# Patient Record
Sex: Male | Born: 1960 | Race: Black or African American | Hispanic: No | Marital: Single | Smoking: Former smoker
Health system: Southern US, Community
[De-identification: ages and names within clinical notes are randomized; demographics above are authoritative.]

## PROBLEM LIST (undated history)

## (undated) DIAGNOSIS — S069XAA Unspecified intracranial injury with loss of consciousness status unknown, initial encounter: Secondary | ICD-10-CM

## (undated) DIAGNOSIS — R42 Dizziness and giddiness: Secondary | ICD-10-CM

## (undated) DIAGNOSIS — I10 Essential (primary) hypertension: Secondary | ICD-10-CM

## (undated) DIAGNOSIS — Z87828 Personal history of other (healed) physical injury and trauma: Secondary | ICD-10-CM

## (undated) DIAGNOSIS — S069X9A Unspecified intracranial injury with loss of consciousness of unspecified duration, initial encounter: Secondary | ICD-10-CM

## (undated) DIAGNOSIS — E7800 Pure hypercholesterolemia, unspecified: Secondary | ICD-10-CM

## (undated) DIAGNOSIS — E119 Type 2 diabetes mellitus without complications: Secondary | ICD-10-CM

---

## 2018-08-08 ENCOUNTER — Emergency Department (HOSPITAL_COMMUNITY)
Admission: EM | Admit: 2018-08-08 | Discharge: 2018-08-09 | Disposition: A | Discharge status: Home / Self Care | Payer: Medicare Other | Attending: Emergency Medicine | Admitting: Emergency Medicine

## 2018-08-08 ENCOUNTER — Other Ambulatory Visit: Payer: Self-pay

## 2018-08-08 ENCOUNTER — Encounter (HOSPITAL_COMMUNITY): Payer: Self-pay | Admitting: Emergency Medicine

## 2018-08-08 DIAGNOSIS — F322 Major depressive disorder, single episode, severe without psychotic features: Secondary | ICD-10-CM | POA: Diagnosis present

## 2018-08-08 DIAGNOSIS — F251 Schizoaffective disorder, depressive type: Secondary | ICD-10-CM | POA: Insufficient documentation

## 2018-08-08 DIAGNOSIS — Z5900 Homelessness unspecified: Secondary | ICD-10-CM

## 2018-08-08 DIAGNOSIS — R45851 Suicidal ideations: Secondary | ICD-10-CM

## 2018-08-08 DIAGNOSIS — Z87820 Personal history of traumatic brain injury: Secondary | ICD-10-CM | POA: Diagnosis not present

## 2018-08-08 DIAGNOSIS — Z008 Encounter for other general examination: Secondary | ICD-10-CM | POA: Diagnosis not present

## 2018-08-08 DIAGNOSIS — I10 Essential (primary) hypertension: Secondary | ICD-10-CM | POA: Insufficient documentation

## 2018-08-08 DIAGNOSIS — Z03818 Encounter for observation for suspected exposure to other biological agents ruled out: Secondary | ICD-10-CM | POA: Diagnosis not present

## 2018-08-08 DIAGNOSIS — Z87891 Personal history of nicotine dependence: Secondary | ICD-10-CM | POA: Insufficient documentation

## 2018-08-08 DIAGNOSIS — F3289 Other specified depressive episodes: Secondary | ICD-10-CM | POA: Diagnosis present

## 2018-08-08 DIAGNOSIS — E119 Type 2 diabetes mellitus without complications: Secondary | ICD-10-CM | POA: Diagnosis not present

## 2018-08-08 DIAGNOSIS — F1410 Cocaine abuse, uncomplicated: Secondary | ICD-10-CM | POA: Diagnosis present

## 2018-08-08 HISTORY — DX: Unspecified intracranial injury with loss of consciousness of unspecified duration, initial encounter: S06.9X9A

## 2018-08-08 HISTORY — DX: Dizziness and giddiness: R42

## 2018-08-08 HISTORY — DX: Personal history of other (healed) physical injury and trauma: Z87.828

## 2018-08-08 HISTORY — DX: Unspecified intracranial injury with loss of consciousness status unknown, initial encounter: S06.9XAA

## 2018-08-08 HISTORY — DX: Type 2 diabetes mellitus without complications: E11.9

## 2018-08-08 HISTORY — DX: Essential (primary) hypertension: I10

## 2018-08-08 HISTORY — DX: Pure hypercholesterolemia, unspecified: E78.00

## 2018-08-08 LAB — CBC
HCT: 39.7 % (ref 39.0–52.0)
Hemoglobin: 12.7 g/dL — ABNORMAL LOW (ref 13.0–17.0)
MCH: 29.7 pg (ref 26.0–34.0)
MCHC: 32 g/dL (ref 30.0–36.0)
MCV: 93 fL (ref 80.0–100.0)
Platelets: 321 10*3/uL (ref 150–400)
RBC: 4.27 MIL/uL (ref 4.22–5.81)
RDW: 13.8 % (ref 11.5–15.5)
WBC: 16.8 10*3/uL — ABNORMAL HIGH (ref 4.0–10.5)
nRBC: 0 % (ref 0.0–0.2)

## 2018-08-08 LAB — COMPREHENSIVE METABOLIC PANEL
ALT: 22 U/L (ref 0–44)
AST: 32 U/L (ref 15–41)
Albumin: 3.7 g/dL (ref 3.5–5.0)
Alkaline Phosphatase: 109 U/L (ref 38–126)
Anion gap: 9 (ref 5–15)
BUN: 13 mg/dL (ref 6–20)
CO2: 25 mmol/L (ref 22–32)
Calcium: 9 mg/dL (ref 8.9–10.3)
Chloride: 103 mmol/L (ref 98–111)
Creatinine, Ser: 1.44 mg/dL — ABNORMAL HIGH (ref 0.61–1.24)
GFR calc Af Amer: >60 mL/min (ref >60)
GFR calc non Af Amer: 53 mL/min — ABNORMAL LOW (ref >60)
Glucose, Bld: 122 mg/dL — ABNORMAL HIGH (ref 70–99)
Potassium: 3.3 mmol/L — ABNORMAL LOW (ref 3.5–5.1)
Sodium: 137 mmol/L (ref 135–145)
Total Bilirubin: 0.8 mg/dL (ref 0.3–1.2)
Total Protein: 8.1 g/dL (ref 6.5–8.1)

## 2018-08-08 LAB — RAPID URINE DRUG SCREEN, HOSP PERFORMED
Amphetamines: NOT DETECTED
Barbiturates: NOT DETECTED
Benzodiazepines: NOT DETECTED
Cocaine: POSITIVE — AB
Opiates: NOT DETECTED
Tetrahydrocannabinol: POSITIVE — AB

## 2018-08-08 LAB — SALICYLATE LEVEL: Salicylate Lvl: <7 mg/dL (ref 2.8–30.0)

## 2018-08-08 LAB — ETHANOL: Alcohol, Ethyl (B): <10 mg/dL (ref <10)

## 2018-08-08 LAB — ACETAMINOPHEN LEVEL: Acetaminophen (Tylenol), Serum: <10 ug/mL — ABNORMAL LOW (ref 10–30)
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
## 2018-08-09 ENCOUNTER — Emergency Department (HOSPITAL_COMMUNITY): Payer: Medicare Other

## 2018-08-09 ENCOUNTER — Other Ambulatory Visit: Payer: Self-pay

## 2018-08-09 DIAGNOSIS — F322 Major depressive disorder, single episode, severe without psychotic features: Secondary | ICD-10-CM | POA: Diagnosis present

## 2018-08-09 DIAGNOSIS — F251 Schizoaffective disorder, depressive type: Secondary | ICD-10-CM | POA: Diagnosis not present

## 2018-08-09 DIAGNOSIS — F1410 Cocaine abuse, uncomplicated: Secondary | ICD-10-CM | POA: Diagnosis present

## 2018-08-09 DIAGNOSIS — Z5900 Homelessness unspecified: Secondary | ICD-10-CM

## 2018-08-09 LAB — URINALYSIS, ROUTINE W REFLEX MICROSCOPIC
Bilirubin Urine: NEGATIVE
Glucose, UA: NEGATIVE mg/dL
Hgb urine dipstick: NEGATIVE
Ketones, ur: NEGATIVE mg/dL
Leukocytes,Ua: NEGATIVE
Nitrite: NEGATIVE
Protein, ur: NEGATIVE mg/dL
Specific Gravity, Urine: 1.002 — ABNORMAL LOW (ref 1.005–1.030)
pH: 5 (ref 5.0–8.0)

## 2018-08-09 LAB — SARS CORONAVIRUS 2 BY RT PCR (HOSPITAL ORDER, PERFORMED IN ~~LOC~~ HOSPITAL LAB): SARS Coronavirus 2: NEGATIVE

## 2018-08-09 MED ORDER — PREGABALIN 25 MG PO CAPS
75.0000 mg | ORAL_CAPSULE | Freq: Once | ORAL | Status: AC
  Administered 2018-08-09: 75 mg via ORAL
  Filled 2018-08-09: qty 3

## 2018-08-09 MED ORDER — GABAPENTIN 300 MG PO CAPS
300.0000 mg | ORAL_CAPSULE | Freq: Three times a day (TID) | ORAL | Status: DC
  Administered 2018-08-09: 300 mg via ORAL
  Filled 2018-08-09: qty 1

## 2018-08-09 MED ORDER — MOMETASONE FURO-FORMOTEROL FUM 200-5 MCG/ACT IN AERO
2.0000 | INHALATION_SPRAY | Freq: Two times a day (BID) | RESPIRATORY_TRACT | Status: DC
  Administered 2018-08-09: 2 via RESPIRATORY_TRACT
  Filled 2018-08-09: qty 8.8

## 2018-08-09 MED ORDER — ACETAMINOPHEN 325 MG PO TABS
650.0000 mg | ORAL_TABLET | Freq: Four times a day (QID) | ORAL | Status: DC | PRN
  Administered 2018-08-09: 650 mg via ORAL
  Filled 2018-08-09: qty 2

## 2018-08-09 MED ORDER — ALBUTEROL SULFATE HFA 108 (90 BASE) MCG/ACT IN AERS: 2.0000 | INHALATION_SPRAY | Freq: Four times a day (QID) | RESPIRATORY_TRACT | Status: DC | PRN

## 2018-08-09 MED ORDER — ALFUZOSIN HCL ER 10 MG PO TB24
10.0000 mg | ORAL_TABLET | Freq: Every day | ORAL | Status: DC
  Administered 2018-08-09: 13:00:00 10 mg via ORAL
  Filled 2018-08-09: qty 1

## 2018-08-09 MED ORDER — CYCLOBENZAPRINE HCL 10 MG PO TABS: 5.0000 mg | ORAL_TABLET | Freq: Two times a day (BID) | ORAL | Status: DC

## 2018-08-09 MED ORDER — FINASTERIDE 5 MG PO TABS
5.0000 mg | ORAL_TABLET | Freq: Every day | ORAL | Status: DC
  Administered 2018-08-09: 13:00:00 5 mg via ORAL
  Filled 2018-08-09: qty 1

## 2018-08-09 NOTE — Progress Notes (Signed)
CSW met with patient to discuss homelessness issues and relocating from [facility] to [place]. CSW provided patient with education and having transfer medicaid/medicare to [facility] and provided patient with information and resources to address his TBI specific funding. CSW attempted to call shelter's with patient and was unable to reach any. CSW provided patient with information on bus routes, shelters, and resources to assist his transfer to the [place] area.  Lamonte Richer, LCSW, [place] Worker II [phone]

## 2018-08-09 NOTE — ED Notes (Addendum)
Pt repeatedly asking for "pain med" and when staff will be talking to Alance (sp?) in [place]. Advised pt APP aware and SW aware of consult order. Pt does not appear in any distress.

## 2018-08-09 NOTE — ED Notes (Signed)
SW in w/pt. 

## 2018-08-09 NOTE — ED Notes (Signed)
Telepsych being performed. 
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
## 2018-08-09 NOTE — ED Notes (Signed)
Pt took Tylenol - stating he would rather than have Percocet. Advised pt no narcotics will be given as per APP. Pt voiced understanding.

## 2018-08-09 NOTE — ED Provider Notes (Signed)
[facility] EMERGENCY DEPARTMENT Provider Note   CSN: [id number] Arrival date & time: 08/08/18  2059     History   Chief Complaint Chief Complaint  Patient presents with   Suicidal    HPI Albert Dixon is a 58 y.o. male.     Patient with history of BPH, neuropathy, HTN, HLD, DM, TBI presents stating he wants to kill himself with plan to walk in front of a car. He has a history of SI in the past. No HI, AVH. He feels physically well without complaint of recent or current illness.  The history is provided by the patient. No language interpreter was used.    Past Medical History:  Diagnosis Date   Diabetes mellitus without complication ([place])    H/O spinal cord injury    Hypercholesteremia    Hypertension    TBI (traumatic brain injury) ([place])    Vertigo     There are no active problems to display for this patient.   History reviewed. No pertinent surgical history.      Home Medications    Prior to Admission medications   Not on File    Family History No family history on file.  Social History Social History   Tobacco Use   Smoking status: Former Smoker   Smokeless tobacco: Never Used  Substance Use Topics   Alcohol use: Yes   Drug use: Not Currently     Allergies   Patient has no known allergies.   Review of Systems Review of Systems  Constitutional: Negative for chills and fever.  HENT: Negative.   Respiratory: Negative.   Cardiovascular: Negative.   Gastrointestinal: Negative.   Musculoskeletal: Negative.   Skin: Negative.   Neurological: Negative.   Psychiatric/Behavioral: Positive for dysphoric mood and suicidal ideas.     Physical Exam Updated Vital Signs BP 107/78    Pulse (!) 127    Temp 98.2 F (36.8 C) (Oral)    Resp 20    SpO2 100%   Physical Exam Constitutional:      Appearance: He is well-developed.  HENT:     Head: Normocephalic.  Neck:     Musculoskeletal: Normal range of motion and  neck supple.  Cardiovascular:     Rate and Rhythm: Normal rate and regular rhythm.     Heart sounds: No murmur.  Pulmonary:     Effort: Pulmonary effort is normal.     Breath sounds: Normal breath sounds.  Abdominal:     General: Bowel sounds are normal.     Palpations: Abdomen is soft.     Tenderness: There is no abdominal tenderness. There is no guarding or rebound.  Musculoskeletal: Normal range of motion.  Skin:    General: Skin is warm and dry.     Findings: No rash.  Neurological:     Mental Status: He is alert and oriented to person, place, and time.     Coordination: Coordination normal.     Gait: Gait normal.  Psychiatric:        Attention and Perception: He does not perceive auditory or visual hallucinations.        Mood and Affect: Affect is flat.        Speech: Speech is delayed.        Behavior: Behavior is slowed.        Thought Content: Thought content includes suicidal ideation.      ED Treatments / Results  Labs (all labs ordered are listed, but  only abnormal results are displayed) Labs Reviewed  COMPREHENSIVE METABOLIC PANEL - Abnormal; Notable for the following components:      Result Value   Potassium 3.3 (*)    Glucose, Bld 122 (*)    Creatinine, Ser 1.44 (*)    GFR calc non Af Amer 53 (*)    All other components within normal limits  ACETAMINOPHEN LEVEL - Abnormal; Notable for the following components:   Acetaminophen (Tylenol), Serum <10 (*)    All other components within normal limits  CBC - Abnormal; Notable for the following components:   WBC 16.8 (*)    Hemoglobin 12.7 (*)    All other components within normal limits  RAPID URINE DRUG SCREEN, HOSP PERFORMED - Abnormal; Notable for the following components:   Cocaine POSITIVE (*)    Tetrahydrocannabinol POSITIVE (*)    All other components within normal limits  URINALYSIS, ROUTINE W REFLEX MICROSCOPIC - Abnormal; Notable for the following components:   Color, Urine STRAW (*)    Specific  Gravity, Urine 1.002 (*)    All other components within normal limits  SARS CORONAVIRUS 2 (HOSPITAL ORDER, PERFORMED IN [facility])  ETHANOL  SALICYLATE LEVEL   Results for orders placed or performed during the hospital encounter of 08/08/18  Comprehensive metabolic panel  Result Value Ref Range   Sodium 137 135 - 145 mmol/L   Potassium 3.3 (L) 3.5 - 5.1 mmol/L   Chloride 103 98 - 111 mmol/L   CO2 25 22 - 32 mmol/L   Glucose, Bld 122 (H) 70 - 99 mg/dL   BUN 13 6 - 20 mg/dL   Creatinine, Ser 1.611.44 (H) 0.61 - 1.24 mg/dL   Calcium 9.0 8.9 - 09.610.3 mg/dL   Total Protein 8.1 6.5 - 8.1 g/dL   Albumin 3.7 3.5 - 5.0 g/dL   AST 32 15 - 41 U/L   ALT 22 0 - 44 U/L   Alkaline Phosphatase 109 38 - 126 U/L   Total Bilirubin 0.8 0.3 - 1.2 mg/dL   GFR calc non Af Amer 53 (L) >60 mL/min   GFR calc Af Amer >60 >60 mL/min   Anion gap 9 5 - 15  Ethanol  Result Value Ref Range   Alcohol, Ethyl (B) <10 <10 mg/dL  Salicylate level  Result Value Ref Range   Salicylate Lvl <7.0 2.8 - 30.0 mg/dL  Acetaminophen level  Result Value Ref Range   Acetaminophen (Tylenol), Serum <10 (L) 10 - 30 ug/mL  cbc  Result Value Ref Range   WBC 16.8 (H) 4.0 - 10.5 K/uL   RBC 4.27 4.22 - 5.81 MIL/uL   Hemoglobin 12.7 (L) 13.0 - 17.0 g/dL   HCT 04.539.7 40.939.0 - 81.152.0 %   MCV 93.0 80.0 - 100.0 fL   MCH 29.7 26.0 - 34.0 pg   MCHC 32.0 30.0 - 36.0 g/dL   RDW 91.413.8 78.211.5 - 95.615.5 %   Platelets 321 150 - 400 K/uL   nRBC 0.0 0.0 - 0.2 %  Rapid urine drug screen (hospital performed)  Result Value Ref Range   Opiates NONE DETECTED NONE DETECTED   Cocaine POSITIVE (A) NONE DETECTED   Benzodiazepines NONE DETECTED NONE DETECTED   Amphetamines NONE DETECTED NONE DETECTED   Tetrahydrocannabinol POSITIVE (A) NONE DETECTED   Barbiturates NONE DETECTED NONE DETECTED  Urinalysis, Routine w reflex microscopic  Result Value Ref Range   Color, Urine STRAW (A) YELLOW   APPearance CLEAR CLEAR   Specific Gravity, Urine  1.002 (L) 1.005 - 1.030   pH 5.0 5.0 - 8.0   Glucose, UA NEGATIVE NEGATIVE mg/dL   Hgb urine dipstick NEGATIVE NEGATIVE   Bilirubin Urine NEGATIVE NEGATIVE   Ketones, ur NEGATIVE NEGATIVE mg/dL   Protein, ur NEGATIVE NEGATIVE mg/dL   Nitrite NEGATIVE NEGATIVE   Leukocytes,Ua NEGATIVE NEGATIVE    EKG None  Radiology No results found. Dg Chest Portable 1 View  Result Date: 08/09/2018 CLINICAL DATA:  Pre admit suicidal ideation EXAM: PORTABLE CHEST 1 VIEW COMPARISON:  None. FINDINGS: No focal opacity or pleural effusion. Heart size upper limits of normal. No pneumothorax. IMPRESSION: No active disease. Electronically Signed   By: Jasmine PangKim  Fujinaga M.D.   On: 08/09/2018 00:52    Procedures Procedures (including critical care time)  Medications Ordered in ED Medications - No data to display   Initial Impression / Assessment and Plan / ED Course  I have reviewed the triage vital signs and the nursing notes.  Pertinent labs & imaging results that were available during my care of the patient were reviewed by me and considered in my medical decision making (see chart for details).      Patient to ED with suicidal ideations, has thought of walking in front of a car. No HI/AVH.   He is well appearing, in NAD. Labs reviewed. There is a leukocytosis of 16 and mild renal dysfunction of 1.44 (Cr). No evidence of infection - no cough, SOB, urinary symptoms, malaise. CXR negative, UA negative. Cause for elevated WBCs unknown, however, felt to be isolated.   Vital signs improved. Originally tachycardic on arrival but WNL when rechecked.   He is considered medically cleared for TTS consult and to determine appropriate disposition.  Final Clinical Impressions(s) / ED Diagnoses   Final diagnoses:  None   1. SI  ED Discharge Orders    None       Danne HarborUpstill, Enzo Treu, PA-C 08/09/18 0119    Dione BoozeGlick, David, MD 08/09/18 [phone]

## 2018-08-09 NOTE — ED Notes (Addendum)
Pt lying in what appears to be comfortable position on bed - lying on back w/right arm/hand behind head - stating "Nurse, I need my Percocet, pain medicine, and my psych meds".

## 2018-08-09 NOTE — ED Notes (Signed)
ALL belongings - 1 labeled belongings bag, 1 green suitcase, Valuables Envelope, and Home Meds - returned to pt - Pt signed verifying all items present.

## 2018-08-09 NOTE — Discharge Instructions (Addendum)
Follow up as discussed with Education officer, museum.

## 2018-08-09 NOTE — ED Notes (Addendum)
Pt states he wants his Medicaid transferred from [facility] (sp?) in [place] to [employer] for his TBI. States he does not want to go back there. States he wants to get set up w/shelter. Denies SI/HI. Per report, pt advised he has been to [facility] in past.

## 2018-08-09 NOTE — ED Notes (Signed)
Pt given coffee at 1040 - advised pt of snack times. Pt now asking for additional coffee - water given.
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
## 2018-08-09 NOTE — ED Notes (Signed)
Lab called to add UA to urine that was previously sent
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
## 2018-08-09 NOTE — ED Notes (Signed)
Labeled belongings bag and green suit case returned to pt - Pt looking through his luggage for clean clothes to put on then will sign for belongings.

## 2018-08-09 NOTE — ED Notes (Signed)
Belongings in locker 6 

## 2018-08-10 ENCOUNTER — Emergency Department (HOSPITAL_COMMUNITY)
Admission: EM | Admit: 2018-08-10 | Discharge: 2018-08-10 | Disposition: A | Discharge status: Home / Self Care | Payer: Medicare Other | Attending: Emergency Medicine | Admitting: Emergency Medicine

## 2018-08-10 ENCOUNTER — Encounter (HOSPITAL_COMMUNITY): Payer: Self-pay | Admitting: Emergency Medicine

## 2018-08-10 ENCOUNTER — Other Ambulatory Visit: Payer: Self-pay

## 2018-08-10 DIAGNOSIS — Z87820 Personal history of traumatic brain injury: Secondary | ICD-10-CM | POA: Insufficient documentation

## 2018-08-10 DIAGNOSIS — E119 Type 2 diabetes mellitus without complications: Secondary | ICD-10-CM | POA: Insufficient documentation

## 2018-08-10 DIAGNOSIS — I10 Essential (primary) hypertension: Secondary | ICD-10-CM | POA: Insufficient documentation

## 2018-08-10 DIAGNOSIS — Z760 Encounter for issue of repeat prescription: Secondary | ICD-10-CM

## 2018-08-10 DIAGNOSIS — Z79899 Other long term (current) drug therapy: Secondary | ICD-10-CM | POA: Diagnosis not present

## 2018-08-10 DIAGNOSIS — Z87891 Personal history of nicotine dependence: Secondary | ICD-10-CM | POA: Insufficient documentation

## 2018-08-10 MED ORDER — ALFUZOSIN HCL ER 10 MG PO TB24
10.0000 mg | ORAL_TABLET | Freq: Every day | ORAL | Status: DC
  Administered 2018-08-10: 10 mg via ORAL
  Filled 2018-08-10: qty 1
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
## 2018-08-10 NOTE — Discharge Instructions (Signed)
Follow up with your Urologist to get your medications.   Follow-up with [facility] to establish a primary care doctor if you do not have one.   Return to the Emergency Department for any worsening concerns.

## 2020-11-21 IMAGING — DX PORTABLE CHEST - 1 VIEW
1 series · 1 of 1 positions shown · non-contrast
Comparison: None.

CLINICAL DATA: Pre admit suicidal ideation

EXAM:
PORTABLE CHEST 1 VIEW

[chest]
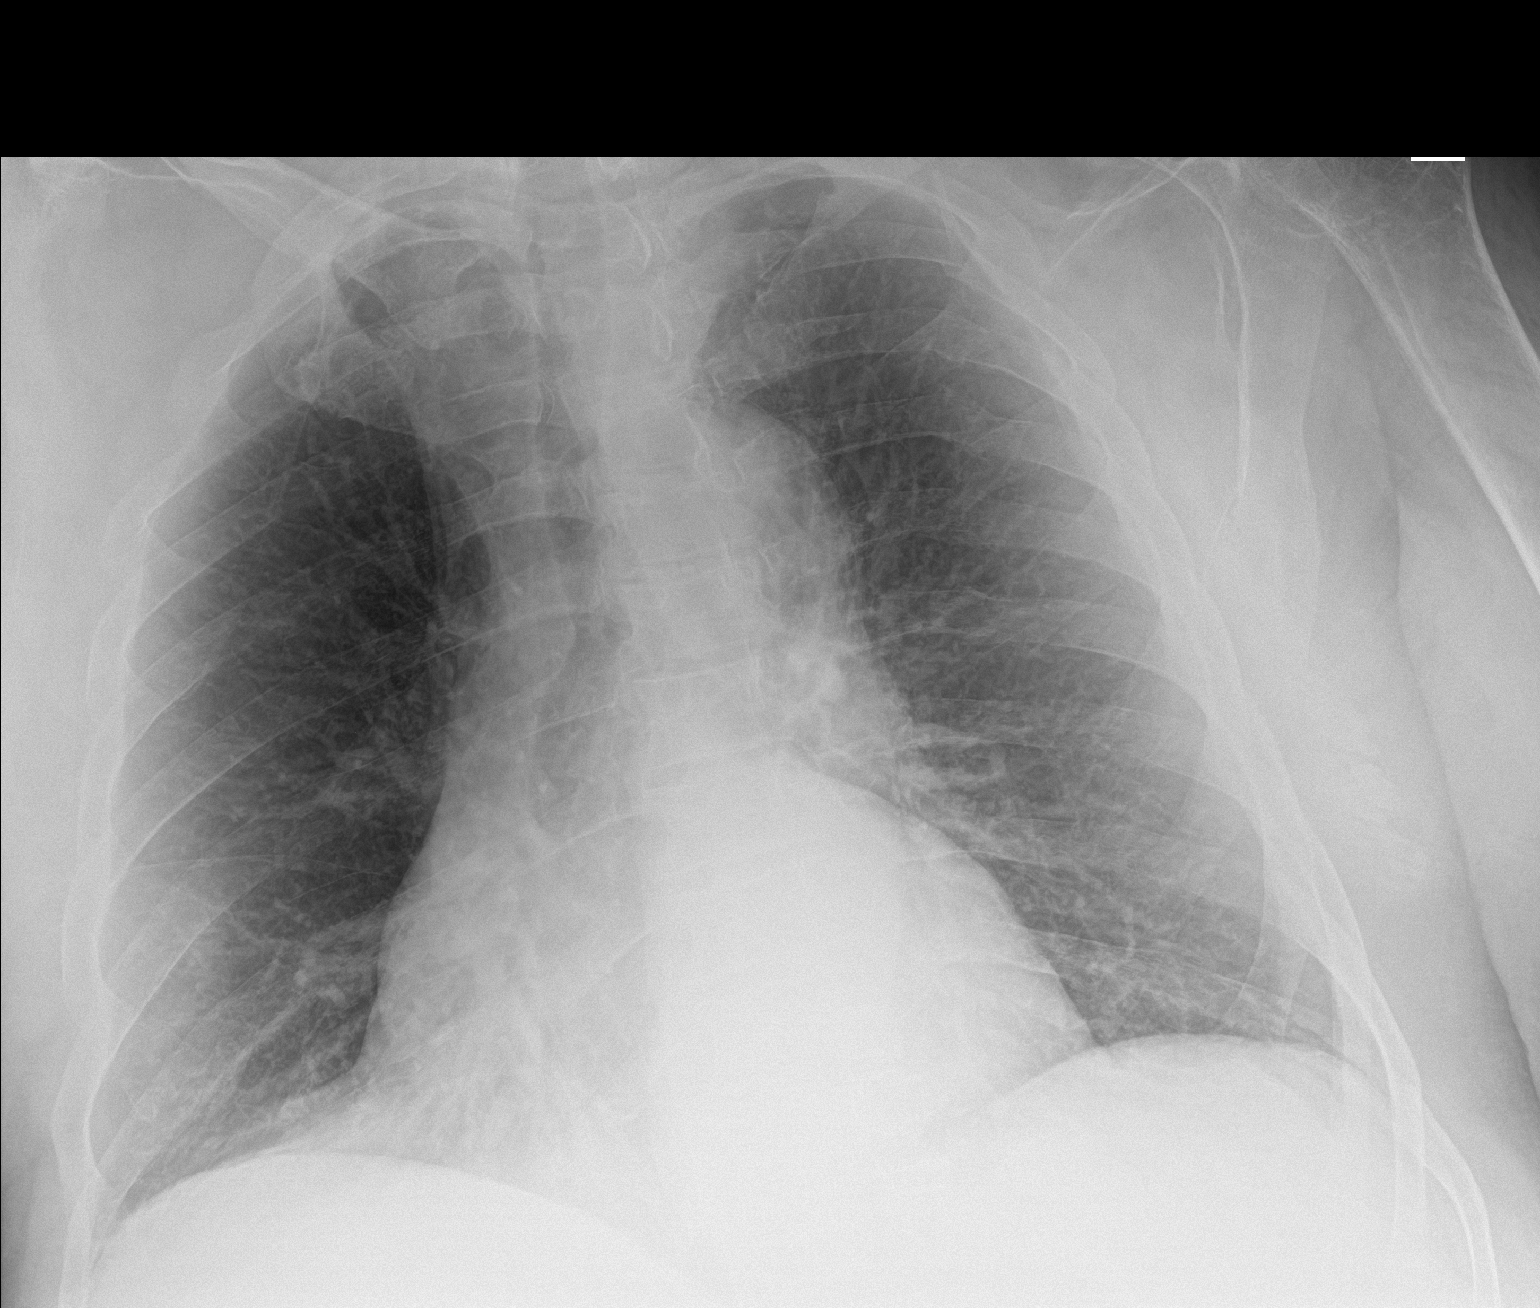

[1 of 1 positions shown; findings below may reference images not displayed]

FINDINGS: No focal opacity or pleural effusion. Heart size upper limits of
normal. No pneumothorax.
IMPRESSION: No active disease.
